# Patient Record
Sex: Male | Born: 1985 | Hispanic: Yes | Marital: Single | Smoking: Former smoker
Health system: Southern US, Community
[De-identification: ages and names within clinical notes are randomized; demographics above are authoritative.]

---

## 2017-11-22 ENCOUNTER — Emergency Department: Payer: Worker's Compensation

## 2017-11-22 ENCOUNTER — Other Ambulatory Visit: Payer: Self-pay

## 2017-11-22 ENCOUNTER — Emergency Department
Admission: EM | Admit: 2017-11-22 | Discharge: 2017-11-22 | Disposition: A | Discharge status: Home / Self Care | Payer: Worker's Compensation | Attending: Emergency Medicine | Admitting: Emergency Medicine

## 2017-11-22 DIAGNOSIS — Y999 Unspecified external cause status: Secondary | ICD-10-CM | POA: Diagnosis not present

## 2017-11-22 DIAGNOSIS — Y929 Unspecified place or not applicable: Secondary | ICD-10-CM | POA: Diagnosis not present

## 2017-11-22 DIAGNOSIS — Y939 Activity, unspecified: Secondary | ICD-10-CM | POA: Diagnosis not present

## 2017-11-22 DIAGNOSIS — S9031XA Contusion of right foot, initial encounter: Secondary | ICD-10-CM | POA: Insufficient documentation

## 2017-11-22 DIAGNOSIS — S99921A Unspecified injury of right foot, initial encounter: Secondary | ICD-10-CM | POA: Diagnosis present

## 2017-11-22 DIAGNOSIS — W208XXA Other cause of strike by thrown, projected or falling object, initial encounter: Secondary | ICD-10-CM | POA: Insufficient documentation

## 2017-11-22 MED ORDER — KETOROLAC TROMETHAMINE 30 MG/ML IJ SOLN
30.0000 mg | Freq: Once | INTRAMUSCULAR | Status: AC
  Administered 2017-11-22: 30 mg via INTRAMUSCULAR
  Filled 2017-11-22: qty 1

## 2017-11-22 MED ORDER — IBUPROFEN 400 MG PO TABS: 400.0000 mg | ORAL_TABLET | Freq: Four times a day (QID) | ORAL | 0 refills | Status: AC | PRN

## 2017-11-22 NOTE — ED Triage Notes (Signed)
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

## 2017-11-22 NOTE — ED Provider Notes (Signed)
[facility] Emergency Department Provider Note  ____________________________________________  Time seen: Approximately 8:34 PM  I have reviewed the triage vital signs and the nursing notes.   HISTORY  Chief Complaint Foot Injury    HPI Brett Ruiz is a 32 y.o. male that presents to the emergency department for evaluation of right foot injury.  Patient states that he dropped a medium sized  rock onto his foot yesterday. The rock was about 6 inches.  No alleviating measures have been attempted.  No numbness or tingling.  No past medical history on file.  There are no active problems to display for this patient.   Prior to Admission medications   Medication Sig Start Date End Date Taking? Authorizing Provider  ibuprofen (ADVIL,MOTRIN) 400 MG tablet Take 1 tablet (400 mg total) by mouth every 6 (six) hours as needed. 11/22/17   Enid Derry, PA-C    Allergies Patient has no known allergies.  No family history on file.  Social History Social History   Tobacco Use  . Smoking status: Not on file  Substance Use Topics  . Alcohol use: Not on file  . Drug use: Not on file     Review of Systems  Gastrointestinal: No nausea, no vomiting.  Musculoskeletal: Positive for foot pain. Skin: Negative for rash, abrasions, lacerations.  Positive for ecchymosis. Neurological: Negative for numbness or tingling   ____________________________________________   PHYSICAL EXAM:  VITAL SIGNS: ED Triage Vitals [11/22/17 1932]  Enc Vitals Group     BP 108/75     Pulse Rate (!) 58     Resp 20     Temp 98.2 F (36.8 C)     Temp Source Oral     SpO2 100 %     Weight 172 lb (78 kg)     Height      Head Circumference      Peak Flow      Pain Score 5     Pain Loc      Pain Edu?      Excl. in GC?      Constitutional: Alert and oriented. Well appearing and in no acute distress. Eyes: Conjunctivae are normal. PERRL. EOMI. Head:  Atraumatic. ENT:      Ears:      Nose: No congestion/rhinnorhea.      Mouth/Throat: Mucous membranes are moist.  Neck: No stridor.  Cardiovascular: Normal rate, regular rhythm.  Good peripheral circulation.  Symmetric dorsalis pedis pulses bilaterally. Respiratory: Normal respiratory effort without tachypnea or retractions. Lungs CTAB. Good air entry to the bases with no decreased or absent breath sounds. Musculoskeletal: Full range of motion to all extremities. No gross deformities appreciated.  Full range of motion of ankle and toes.  No swelling to foot. Neurologic:  Normal speech and language. No gross focal neurologic deficits are appreciated.  Skin:  Skin is warm, dry and intact.  Ecchymosis to distal volar foot.  No ecchymosis to toes. Psychiatric: Mood and affect are normal. Speech and behavior are normal. Patient exhibits appropriate insight and judgement.   ____________________________________________   LABS (all labs ordered are listed, but only abnormal results are displayed)  Labs Reviewed - No data to display ____________________________________________  EKG   ____________________________________________  RADIOLOGY Lexine Baton, personally viewed and evaluated these images (plain radiographs) as part of my medical decision making, as well as reviewing the written report by the radiologist.  Dg Foot Complete Right  Result Date: 11/22/2017 CLINICAL DATA:  [employer]  fell on foot yesterday. EXAM: RIGHT FOOT COMPLETE - 3+ VIEW COMPARISON:  None. FINDINGS: There is no evidence of fracture or dislocation. There is no evidence of arthropathy or other focal bone abnormality. Soft tissues are unremarkable. IMPRESSION: Negative. Electronically Signed   By: Marlan Palau M.D.   On: 11/22/2017 20:03    ____________________________________________    PROCEDURES  Procedure(s) performed:    Procedures    Medications  ketorolac (TORADOL) 30 MG/ML injection 30 mg (30 mg  Intramuscular Given 11/22/17 2050)     ____________________________________________   INITIAL IMPRESSION / ASSESSMENT AND PLAN / ED COURSE  Pertinent labs & imaging results that were available during my care of the patient were reviewed by me and considered in my medical decision making (see chart for details).  Review of the [place] CSRS was performed in accordance of the [facility] prior to dispensing any controlled drugs.   Patient presented to the emergency department for evaluation of foot injury.  Symptoms are consistent with contusion.  X-ray negative for bony abnormalities.  Ace wrap was placed.  Postop shoe and crutches were given.  Patient will be discharged home with prescriptions for ibuprofen. Patient is to follow up with podiatry as directed. Patient is given ED precautions to return to the ED for any worsening or new symptoms.    ____________________________________________  FINAL CLINICAL IMPRESSION(S) / ED DIAGNOSES  Final diagnoses:  Contusion of right foot, initial encounter      NEW MEDICATIONS STARTED DURING THIS VISIT:  ED Discharge Orders         Ordered    ibuprofen (ADVIL,MOTRIN) 400 MG tablet  Every 6 hours PRN     11/22/17 2104              This chart was dictated using voice recognition software/Dragon. Despite best efforts to proofread, errors can occur which can change the meaning. Any change was purely unintentional.    Enid Derry, PA-C 11/22/17 2228    Rockne Menghini, MD 11/22/17 2230

## 2017-11-22 NOTE — ED Notes (Signed)
Pt filing workers comp. NO profile in system   Company information:  [employer]  [phone] HR  [phone] Sherri Rad Boss  [address] [employer] [address] [place] [address]  Urine drug screen required    LM EDT

## 2018-03-19 ENCOUNTER — Other Ambulatory Visit: Payer: Self-pay

## 2018-03-19 DIAGNOSIS — R197 Diarrhea, unspecified: Secondary | ICD-10-CM | POA: Insufficient documentation

## 2018-03-19 DIAGNOSIS — R112 Nausea with vomiting, unspecified: Secondary | ICD-10-CM | POA: Insufficient documentation

## 2018-03-19 DIAGNOSIS — R1033 Periumbilical pain: Secondary | ICD-10-CM | POA: Insufficient documentation

## 2018-03-19 DIAGNOSIS — Z87891 Personal history of nicotine dependence: Secondary | ICD-10-CM | POA: Insufficient documentation

## 2018-03-19 LAB — CBC
HCT: 43.6 % (ref 39.0–52.0)
Hemoglobin: 14.6 g/dL (ref 13.0–17.0)
MCH: 29.9 pg (ref 26.0–34.0)
MCHC: 33.5 g/dL (ref 30.0–36.0)
MCV: 89.3 fL (ref 80.0–100.0)
NRBC: 0 % (ref 0.0–0.2)
PLATELETS: 329 10*3/uL (ref 150–400)
RBC: 4.88 MIL/uL (ref 4.22–5.81)
RDW: 12.3 % (ref 11.5–15.5)
WBC: 12.9 10*3/uL — ABNORMAL HIGH (ref 4.0–10.5)

## 2018-03-19 MED ORDER — SODIUM CHLORIDE 0.9% FLUSH: 3.0000 mL | Freq: Once | INTRAVENOUS | Status: DC

## 2018-03-19 NOTE — ED Triage Notes (Signed)
Patient presents with generalized abdominal pain. Onset of symptoms yesterday, progressively worse. Vomiting and diarrhea at home but denies fever.

## 2018-03-20 ENCOUNTER — Emergency Department
Admission: EM | Admit: 2018-03-20 | Discharge: 2018-03-20 | Disposition: A | Discharge status: Home / Self Care | Payer: Self-pay | Attending: Emergency Medicine | Admitting: Emergency Medicine

## 2018-03-20 ENCOUNTER — Emergency Department: Payer: Self-pay

## 2018-03-20 ENCOUNTER — Encounter: Payer: Self-pay | Admitting: Radiology

## 2018-03-20 DIAGNOSIS — R1033 Periumbilical pain: Secondary | ICD-10-CM

## 2018-03-20 DIAGNOSIS — R197 Diarrhea, unspecified: Secondary | ICD-10-CM

## 2018-03-20 DIAGNOSIS — R112 Nausea with vomiting, unspecified: Secondary | ICD-10-CM

## 2018-03-20 LAB — URINALYSIS, COMPLETE (UACMP) WITH MICROSCOPIC
BACTERIA UA: NONE SEEN
BILIRUBIN URINE: NEGATIVE
Glucose, UA: NEGATIVE mg/dL
Hgb urine dipstick: NEGATIVE
KETONES UR: NEGATIVE mg/dL
LEUKOCYTES UA: NEGATIVE
Nitrite: NEGATIVE
PH: 6 (ref 5.0–8.0)
Protein, ur: NEGATIVE mg/dL
SQUAMOUS EPITHELIAL / LPF: NONE SEEN (ref 0–5)
Specific Gravity, Urine: 1.025 (ref 1.005–1.030)

## 2018-03-20 LAB — COMPREHENSIVE METABOLIC PANEL
ALBUMIN: 4.5 g/dL (ref 3.5–5.0)
ALK PHOS: 71 U/L (ref 38–126)
ALT: 44 U/L (ref 0–44)
AST: 31 U/L (ref 15–41)
Anion gap: 8 (ref 5–15)
BILIRUBIN TOTAL: 0.6 mg/dL (ref 0.3–1.2)
BUN: 19 mg/dL (ref 6–20)
CO2: 27 mmol/L (ref 22–32)
Calcium: 8.8 mg/dL — ABNORMAL LOW (ref 8.9–10.3)
Chloride: 105 mmol/L (ref 98–111)
Creatinine, Ser: 0.71 mg/dL (ref 0.61–1.24)
GFR calc Af Amer: >60 mL/min (ref >60)
GLUCOSE: 132 mg/dL — AB (ref 70–99)
POTASSIUM: 3.8 mmol/L (ref 3.5–5.1)
Sodium: 140 mmol/L (ref 135–145)
TOTAL PROTEIN: 7.4 g/dL (ref 6.5–8.1)

## 2018-03-20 LAB — LIPASE, BLOOD: Lipase: 27 U/L (ref 11–51)

## 2018-03-20 MED ORDER — ONDANSETRON 4 MG PO TBDP: 4.0000 mg | ORAL_TABLET | Freq: Three times a day (TID) | ORAL | 0 refills | Status: AC | PRN

## 2018-03-20 MED ORDER — IOPAMIDOL (ISOVUE-300) INJECTION 61%
30.0000 mL | Freq: Once | INTRAVENOUS | Status: AC | PRN
  Administered 2018-03-20: 30 mL via ORAL

## 2018-03-20 MED ORDER — MORPHINE SULFATE (PF) 2 MG/ML IV SOLN
2.0000 mg | Freq: Once | INTRAVENOUS | Status: AC
  Administered 2018-03-20: 2 mg via INTRAVENOUS
  Filled 2018-03-20: qty 1

## 2018-03-20 MED ORDER — IOPAMIDOL (ISOVUE-300) INJECTION 61%
100.0000 mL | Freq: Once | INTRAVENOUS | Status: AC | PRN
  Administered 2018-03-20: 100 mL via INTRAVENOUS

## 2018-03-20 MED ORDER — ONDANSETRON HCL 4 MG/2ML IJ SOLN
4.0000 mg | Freq: Once | INTRAMUSCULAR | Status: AC
  Administered 2018-03-20: 4 mg via INTRAVENOUS
  Filled 2018-03-20: qty 2

## 2018-03-20 MED ORDER — SODIUM CHLORIDE 0.9 % IV BOLUS
1000.0000 mL | Freq: Once | INTRAVENOUS | Status: AC
  Administered 2018-03-20: 1000 mL via INTRAVENOUS

## 2018-03-20 NOTE — Discharge Instructions (Addendum)
1.  You may take Zofran as needed for nausea/vomiting. 2.  Eat a clear liquid diet x1 day, then BRAT diet x3 days, then slowly advance diet as tolerated. 3.  Drink plenty of fluids daily. 4.  Return to the ER for worsening symptoms, persistent vomiting, difficulty breathing or other concerns.

## 2018-03-20 NOTE — ED Provider Notes (Signed)
[facility] Emergency Department Provider Note   ____________________________________________   First MD Initiated Contact with Patient 03/20/18 [phone]     (approximate)  I have reviewed the triage vital signs and the nursing notes.   HISTORY  Chief Complaint Abdominal Pain    HPI Meriel FlavorsRene Martinez Gutierrez is a 33 y.o. male who presents to the ED from home with a chief complaint of abdominal pain, nausea, vomiting and diarrhea.  Onset of symptoms yesterday.  Complains of simultaneous vomiting and diarrhea associated with abdominal cramping around his umbilicus.  Denies associated fever, chills, chest pain, shortness of breath, dysuria.  Denies recent travel or trauma.    Past medical history None  There are no active problems to display for this patient.   History reviewed. No pertinent surgical history.  Prior to Admission medications   Medication Sig Start Date End Date Taking? Authorizing Provider  ibuprofen (ADVIL,MOTRIN) 400 MG tablet Take 1 tablet (400 mg total) by mouth every 6 (six) hours as needed. 11/22/17   Enid DerryWagner, Ashley, PA-C    Allergies Patient has no known allergies.  No family history on file.  Social History Social History   Tobacco Use  . Smoking status: Former Games developermoker  . Smokeless tobacco: Never Used  Substance Use Topics  . Alcohol use: Not on file  . Drug use: Not on file    Review of Systems  Constitutional: No fever/chills Eyes: No visual changes. ENT: No sore throat. Cardiovascular: Denies chest pain. Respiratory: Denies shortness of breath. Gastrointestinal: Positive for periumbilical abdominal pain, nausea, vomiting and diarrhea.  No constipation. Genitourinary: Negative for dysuria. Musculoskeletal: Negative for back pain. Skin: Negative for rash. Neurological: Negative for headaches, focal weakness or numbness.   ____________________________________________   PHYSICAL EXAM:  VITAL SIGNS: ED Triage  Vitals  Enc Vitals Group     BP 03/19/18 2330 (!) 162/95     Pulse Rate 03/19/18 2330 63     Resp 03/19/18 2330 18     Temp 03/19/18 2330 98.1 F (36.7 C)     Temp Source 03/19/18 2330 Oral     SpO2 03/19/18 2330 99 %     Weight 03/19/18 2331 170 lb (77.1 kg)     Height 03/19/18 2331 5\' 6"  (1.676 m)     Head Circumference --      Peak Flow --      Pain Score 03/19/18 2331 10     Pain Loc --      Pain Edu? --      Excl. in GC? --     Constitutional: Alert and oriented. Well appearing and in mild acute distress. Eyes: Conjunctivae are normal. PERRL. EOMI. Head: Atraumatic. Nose: No congestion/rhinnorhea. Mouth/Throat: Mucous membranes are moist.  Oropharynx non-erythematous. Neck: No stridor.   Cardiovascular: Normal rate, regular rhythm. Grossly normal heart sounds.  Good peripheral circulation. Respiratory: Normal respiratory effort.  No retractions. Lungs CTAB. Gastrointestinal: Soft and mildly tender to umbilicus without rebound or guarding. No distention. No abdominal bruits. No CVA tenderness. Musculoskeletal: No lower extremity tenderness nor edema.  No joint effusions. Neurologic:  Normal speech and language. No gross focal neurologic deficits are appreciated. No gait instability. Skin:  Skin is warm, dry and intact. No rash noted. Psychiatric: Mood and affect are normal. Speech and behavior are normal.  ____________________________________________   LABS (all labs ordered are listed, but only abnormal results are displayed)  Labs Reviewed  COMPREHENSIVE METABOLIC PANEL - Abnormal; Notable for the following components:  Result Value   Glucose, Bld 132 (*)    Calcium 8.8 (*)    All other components within normal limits  CBC - Abnormal; Notable for the following components:   WBC 12.9 (*)    All other components within normal limits  URINALYSIS, COMPLETE (UACMP) WITH MICROSCOPIC - Abnormal; Notable for the following components:   Color, Urine YELLOW (*)     APPearance CLEAR (*)    All other components within normal limits  C DIFFICILE QUICK SCREEN W PCR REFLEX  GASTROINTESTINAL PANEL BY PCR, STOOL (REPLACES STOOL CULTURE)  LIPASE, BLOOD   ____________________________________________  EKG  None ____________________________________________  RADIOLOGY  ED MD interpretation: Unremarkable CT abdomen/pelvis  Official radiology report(s): Ct Abdomen Pelvis W Contrast  Result Date: 03/20/2018 CLINICAL DATA:  Periumbilical pain with nausea, vomiting, and diarrhea EXAM: CT ABDOMEN AND PELVIS WITH CONTRAST TECHNIQUE: Multidetector CT imaging of the abdomen and pelvis was performed using the standard protocol following bolus administration of intravenous contrast. CONTRAST:  100mL ISOVUE-300 IOPAMIDOL (ISOVUE-300) INJECTION 61% COMPARISON:  None. FINDINGS: Lower chest:  Asymmetric left gynecomastia Hepatobiliary: No focal liver abnormality.No evidence of biliary obstruction or stone. Pancreas: Unremarkable. Spleen: Unremarkable. Adrenals/Urinary Tract: Negative adrenals. No hydronephrosis or stone. Unremarkable bladder. Stomach/Bowel:  No obstruction. No appendicitis. Vascular/Lymphatic: No acute vascular abnormality. Distorted appearance of the right external iliac arteries attributed to motion artifact. No mass or adenopathy. Reproductive:No pathologic findings. Other: No ascites or pneumoperitoneum. Musculoskeletal: No acute abnormalities. IMPRESSION: Negative for appendicitis or other acute finding. Electronically Signed   By: Marnee SpringJonathon  Watts M.D.   On: 03/20/2018 06:11    ____________________________________________   PROCEDURES  Procedure(s) performed: None  Procedures  Critical Care performed: No  ____________________________________________   INITIAL IMPRESSION / ASSESSMENT AND PLAN / ED COURSE  As part of my medical decision making, I reviewed the following data within the electronic MEDICAL RECORD NUMBER History obtained from family,  Nursing notes reviewed and incorporated, Labs reviewed, Old chart reviewed, Radiograph reviewed and Notes from prior ED visits    33 year old Hispanic male who presents to the ED with periumbilical abdominal pain, nausea/vomiting/diarrhea. Differential diagnosis includes, but is not limited to, acute appendicitis, renal colic, testicular torsion, urinary tract infection/pyelonephritis, prostatitis,  epididymitis, diverticulitis, small bowel obstruction or ileus, colitis, abdominal aortic aneurysm, gastroenteritis, hernia, etc.  Laboratory and urinalysis results unremarkable except for mild leukocytosis.  Will initiate IV fluid resuscitation, administer 2 mg IV morphine for pain paired with 4 mg IV Zofran for nausea.  Will proceed with CT abdomen/pelvis to evaluate for intra-abdominal etiology of patient's pain.  Clinical Course as of Mar 21 631  [facility]  [id number] Patient is feeling much better.  Tolerated oral contrast without emesis.  Unable to provide stool specimen.  Will discharge home with prescription for Zofran to use as needed.  Strict return precautions given.  Patient verbalizes understanding and agrees with plan of care.   [JS]    Clinical Course User Index [JS] Irean HongSung, Angelyse Heslin J, MD     ____________________________________________   FINAL CLINICAL IMPRESSION(S) / ED DIAGNOSES  Final diagnoses:  Periumbilical abdominal pain  Nausea vomiting and diarrhea     ED Discharge Orders    None       Note:  This document was prepared using Dragon voice recognition software and may include unintentional dictation errors.    Irean HongSung, Tupac Jeffus J, MD 03/20/18 [phone]

## 2018-03-20 NOTE — ED Notes (Signed)
Pt has finished po contrast. Ct notified.
